# Patient Record
Sex: Male | Born: 1995 | Race: White | Hispanic: No | Marital: Single | Smoking: Never smoker
Health system: Southern US, Community
[De-identification: ages and names within clinical notes are randomized; demographics above are authoritative.]

## PROBLEM LIST (undated history)

## (undated) DIAGNOSIS — E039 Hypothyroidism, unspecified: Secondary | ICD-10-CM

## (undated) DIAGNOSIS — E0500 Thyrotoxicosis with diffuse goiter without thyrotoxic crisis or storm: Secondary | ICD-10-CM

---
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
## 2015-07-28 HISTORY — PX: THYROIDECTOMY: SHX17

## 2022-09-15 NOTE — Discharge Instructions (Signed)
T & A INSTRUCTION SHEET - [facility]  PAUL JUENGEL, MD    INFORMATION SHEET FOR A TONSILLECTOMY AND ADENDOIDECTOMY  About Your Tonsils and Adenoids  The tonsils and adenoids are normal body tissues that are part of our immune system.  They normally help to protect us against diseases that may enter our mouth and nose. However, sometimes the tonsils and/or adenoids become too large and obstruct our breathing, especially at night.    If either of these things happen it helps to remove the tonsils and adenoids in order to become healthier. The operation to remove the tonsils and adenoids is called a tonsillectomy and adenoidectomy.  The Location of Your Tonsils and Adenoids  The tonsils are located in the back of the throat on both side and sit in a cradle of muscles. The adenoids are located in the roof of the mouth, behind the nose, and closely associated with the opening of the Eustachian tube to the ear.  Surgery on Tonsils and Adenoids  A tonsillectomy and adenoidectomy is a short operation which takes about thirty minutes.  This includes being put to sleep and being awakened. Tonsillectomies and adenoidectomies are performed at [facility] and may require observation period in the recovery room prior to going home. Children are required to remain in recovery for at least 45 minutes.   Following the Operation for a Tonsillectomy  A cautery machine is used to control bleeding. Bleeding from a tonsillectomy and adenoidectomy is minimal and postoperatively the risk of bleeding is approximately four percent, although this rarely life threatening.  After your tonsillectomy and adenoidectomy post-op care at home: 1. Our patients are able to go home the same day. You may be given prescriptions for pain medications, if indicated. 2. It is extremely important to remember that fluid intake is of utmost importance after a tonsillectomy. The  amount that you drink must be maintained in the postoperative period. A good indication of whether a child is getting enough fluid is whether his/her urine output is constant. As long as children are urinating or wetting their diaper every 6 - 8 hours this is usually enough fluid intake.   3. Although rare, this is a risk of some bleeding in the first ten days after surgery. This usually occurs between day five and nine postoperatively. This risk of bleeding is approximately four percent. If you or your child should have any bleeding you should remain calm and notify our office or go directly to the emergency room at [facility] where they will contact us. Our doctors are available seven days a week for notification. We recommend sitting up quietly in a chair, place an ice pack on the front of the neck and spitting out the blood gently until we are able to contact you. Adults should gargle gently with ice water and this may help stop the bleeding. If the bleeding does not stop after a short time, i.e. 10 to 15 minutes, or seems to be increasing again, please contact us or go to the hospital.   4. It is common for the pain to be worse at 5 - 7 days postoperatively. This occurs because the "scab" is peeling off and the mucous membrane (skin of the throat) is growing back where the tonsils were.   5. It is common for a low-grade fever, less than 102, during the first week after a tonsillectomy and adenoidectomy. It is usually due to   not drinking enough liquids, and we suggest your use liquid Tylenol (acetaminophen) or the pain medicine with Tylenol (acetaminophen) prescribed in order to keep your temperature below 102. Please follow the directions on the back of the bottle. 6. Recommendations for post-operative pain in children and adults: a) For Children 12 and younger: Recommendations are for oral Tylenol (acetaminophen) and oral Motrin (ibuprofen). Administer the Tylenol (acetaminophen) and  Motrin as stated on bottle for patient's age/weight. Sometimes it may be necessary to alternate the Tylenol (acetaminophen) and Motrin for improved pain control. Motrin (ibuprofen) does last slightly longer so many patients benefit from being given this prior to bedtime. All children should avoid Aspirin products for 2 weeks following surgery. b) For children over the age of 12: Tylenol (acetaminophen) is the preferred first choice for pain control. Depending on your child's size, sometimes they will be given a combination of Tylenol (acetaminophen) and hydrocodone medication or sometimes it will be recommended they take Motrin (ibuprofen) in addition to the Tylenol (acetaminophen). Narcotics should always be used with caution in children following surgery as they can suppress their breathing and switching to over the counter Tylenol (acetaminophen) and Motrin (ibuprofen) as soon as possible is recommended. All patients should avoid Aspirin products for 2 weeks following surgery. c) Adults: Usually adults will require a narcotic pain medication following a tonsillectomy. This usually has either hydrocodone or oxycodone in it and can usually be taken every 4 to 6 hours as needed for moderate pain. If the medication does not have Tylenol (acetaminophen) in it, you may also supplement Tylenol (acetaminophen) as needed every 4 to 6 hours for breakthrough or mild pain. Adults should avoid Aspirin, Aleve, Motrin, and Ibuprofen products for 2 weeks following surgery as they can increase your risk of bleeding. 7. If you happen to look in the mirror or into your child's mouth you will see white/gray patches on the back of the throat. This is what a scab looks like in the mouth and is normal after having a tonsillectomy and adenoidectomy. They will disappear once the tonsil areas heal completely. However, it may cause a noticeable odor, and this too will disappear with time.     8. You or your child may experience ear  pain after having a tonsillectomy and adenoidectomy.  This is called referred pain and comes from the throat, but it is felt in the ears.  Ear pain is quite common and expected. It will usually go away after ten days. There is usually nothing wrong with the ears, and it is primarily due to the healing area stimulating the nerve to the ear that runs along the side of the throat. Use either the prescribed pain medicine or Tylenol (acetaminophen) as needed.  9. The throat tissues after a tonsillectomy are obviously sensitive. Smoking around children who have had a tonsillectomy significantly increases the risk of bleeding. DO NOT SMOKE!  What to Expect Each Day  First Day at Home 1. Patients will be discharged home the same day.  2. Drink at least four glasses of liquid a day. Clear, cool liquids are recommended. Fruit juices containing citric acid are not recommended because they tend to cause pain. Carbonated beverages are allowed if you pour them from glass to glass to remove the bubbles as these tend to cause discomfort. Avoid alcoholic beverages.  3. Eat very soft foods such as soups, broth, jello, custard, pudding, ice cream, popsicles, applesauce, mashed potatoes, and in general anything that you can crush between your   tongue and the roof of your mouth. Try adding Carnation Instant Breakfast Mix into your food for extra calories. It is not uncommon to lose 5 to 10 pounds of fluid weight. The weight will be gained back quickly once you're feeling better and drinking more.  4. Sleep with your head elevated on two pillows for about three days to help decrease the swelling.  5. DO NOT SMOKE!  Day Two  1. Rest as much as possible. Use common sense in your activities.  2. Continue drinking at least four glasses of liquid per day.  3. Follow the soft diet.  4. Use your pain medication as needed.  Day Three  1. Advance your activity as you are able and continue to follow the previous day's suggestions.   Days Four Through Six  1. Advance your diet and begin to eat more solid foods such as chopped hamburger. 2. Advance your activities slowly. Children should be kept mostly around the house.  3. Not uncommonly, there will be more pain at this time. It is temporary, usually lasting a day or two.  Day Seven Through Ten  1. Most individuals by this time are able to return to work or school unless otherwise instructed. Consider sending children back to school for a half day on the first day back. 

## 2022-09-20 ENCOUNTER — Encounter: Payer: Self-pay | Admitting: Otolaryngology

## 2022-09-20 NOTE — Anesthesia Preprocedure Evaluation (Signed)
Anesthesia Evaluation  Patient identified by MRN, date of birth, ID band Patient awake    Reviewed: Allergy & Precautions, H&P , NPO status , Patient's Chart, lab work & pertinent test results  Airway Mallampati: I  TM Distance: >3 FB Neck ROM: Full    Dental no notable dental hx.    Pulmonary neg pulmonary ROS   Pulmonary exam normal breath sounds clear to auscultation       Cardiovascular negative cardio ROS Normal cardiovascular exam Rhythm:Regular Rate:Normal     Neuro/Psych negative neurological ROS  negative psych ROS   GI/Hepatic negative GI ROS, Neg liver ROS,,,  Endo/Other  Hypothyroidism    Renal/GU negative Renal ROS  negative genitourinary   Musculoskeletal negative musculoskeletal ROS (+)    Abdominal   Peds negative pediatric ROS (+)  Hematology negative hematology ROS (+)   Anesthesia Other Findings Hypothyroidism Grave's dz   Reproductive/Obstetrics negative OB ROS                             Anesthesia Physical Anesthesia Plan  ASA: 2  Anesthesia Plan: General ETT   Post-op Pain Management:    Induction: Intravenous  PONV Risk Score and Plan:   Airway Management Planned: Oral ETT  Additional Equipment:   Intra-op Plan:   Post-operative Plan: Extubation in OR  Informed Consent: I have reviewed the patients History and Physical, chart, labs and discussed the procedure including the risks, benefits and alternatives for the proposed anesthesia with the patient or authorized representative who has indicated his/her understanding and acceptance.     Dental Advisory Given  Plan Discussed with: Anesthesiologist, CRNA and Surgeon  Anesthesia Plan Comments: (Patient consented for risks of anesthesia including but not limited to:  - adverse reactions to medications - damage to eyes, teeth, lips or other oral mucosa - nerve damage due to positioning  - sore  throat or hoarseness - Damage to heart, brain, nerves, lungs, other parts of body or loss of life  Patient voiced understanding.)       Anesthesia Quick Evaluation

## 2022-09-23 ENCOUNTER — Encounter
Admission: RE | Disposition: A | Discharge status: Home / Self Care | Payer: Self-pay | Source: Home / Self Care | Attending: Otolaryngology

## 2022-09-23 ENCOUNTER — Other Ambulatory Visit: Payer: Self-pay

## 2022-09-23 ENCOUNTER — Ambulatory Visit: Payer: Managed Care, Other (non HMO) | Admitting: Anesthesiology

## 2022-09-23 ENCOUNTER — Encounter: Payer: Self-pay | Admitting: Otolaryngology

## 2022-09-23 ENCOUNTER — Ambulatory Visit
Admission: RE | Admit: 2022-09-23 | Discharge: 2022-09-23 | Disposition: A | Discharge status: Home / Self Care | Payer: Managed Care, Other (non HMO) | Attending: Otolaryngology | Admitting: Otolaryngology

## 2022-09-23 DIAGNOSIS — E0500 Thyrotoxicosis with diffuse goiter without thyrotoxic crisis or storm: Secondary | ICD-10-CM | POA: Insufficient documentation

## 2022-09-23 DIAGNOSIS — J3501 Chronic tonsillitis: Secondary | ICD-10-CM | POA: Diagnosis present

## 2022-09-23 HISTORY — DX: Hypothyroidism, unspecified: E03.9

## 2022-09-23 HISTORY — DX: Thyrotoxicosis with diffuse goiter without thyrotoxic crisis or storm: E05.00

## 2022-09-23 HISTORY — PX: TONSILLECTOMY: SHX5217

## 2022-09-23 SURGERY — TONSILLECTOMY
Anesthesia: General | Laterality: Bilateral

## 2022-09-23 MED ORDER — LIDOCAINE HCL (CARDIAC) PF 100 MG/5ML IV SOSY
PREFILLED_SYRINGE | INTRAVENOUS | Status: DC | PRN
  Administered 2022-09-23: 100 mg via INTRAVENOUS

## 2022-09-23 MED ORDER — SUCCINYLCHOLINE CHLORIDE 200 MG/10ML IV SOSY
PREFILLED_SYRINGE | INTRAVENOUS | Status: DC | PRN
  Administered 2022-09-23: 100 mg via INTRAVENOUS

## 2022-09-23 MED ORDER — MIDAZOLAM HCL 5 MG/5ML IJ SOLN
INTRAMUSCULAR | Status: DC | PRN
  Administered 2022-09-23: 2 mg via INTRAVENOUS

## 2022-09-23 MED ORDER — PROPOFOL 10 MG/ML IV BOLUS
INTRAVENOUS | Status: DC | PRN
  Administered 2022-09-23: 200 mg via INTRAVENOUS

## 2022-09-23 MED ORDER — DEXMEDETOMIDINE HCL IN NACL 80 MCG/20ML IV SOLN
INTRAVENOUS | Status: DC | PRN
  Administered 2022-09-23: 8 ug via INTRAVENOUS
  Administered 2022-09-23: 4 ug via INTRAVENOUS

## 2022-09-23 MED ORDER — DEXAMETHASONE SODIUM PHOSPHATE 4 MG/ML IJ SOLN
INTRAMUSCULAR | Status: DC | PRN
  Administered 2022-09-23: 12 mg via INTRAVENOUS

## 2022-09-23 MED ORDER — OXYCODONE HCL 5 MG/5ML PO SOLN
10.0000 mg | Freq: Once | ORAL | Status: AC
  Administered 2022-09-23: 10 mg via ORAL

## 2022-09-23 MED ORDER — ACETAMINOPHEN 10 MG/ML IV SOLN: 1000.0000 mg | Freq: Once | INTRAVENOUS | Status: DC

## 2022-09-23 MED ORDER — HYDROCODONE-ACETAMINOPHEN 7.5-325 MG/15ML PO SOLN: 15.0000 mL | ORAL | 0 refills | Status: AC | PRN

## 2022-09-23 MED ORDER — ACETAMINOPHEN 10 MG/ML IV SOLN
1000.0000 mg | Freq: Once | INTRAVENOUS | Status: AC
  Administered 2022-09-23: 1000 mg via INTRAVENOUS

## 2022-09-23 MED ORDER — LACTATED RINGERS IV SOLN: INTRAVENOUS | Status: DC

## 2022-09-23 MED ORDER — FENTANYL CITRATE (PF) 100 MCG/2ML IJ SOLN
INTRAMUSCULAR | Status: DC | PRN
  Administered 2022-09-23: 100 ug via INTRAVENOUS

## 2022-09-23 MED ORDER — ONDANSETRON HCL 4 MG/2ML IJ SOLN
INTRAMUSCULAR | Status: DC | PRN
  Administered 2022-09-23: 4 mg via INTRAVENOUS

## 2022-09-23 SURGICAL SUPPLY — 13 items
ANTIFOG SOL W/FOAM PAD STRL (MISCELLANEOUS) ×1
BLADE ELECT COATED/INSUL 125 (ELECTRODE) ×1 IMPLANT
CANISTER SUCT 1200ML W/VALVE (MISCELLANEOUS) ×1 IMPLANT
ELECT REM PT RETURN 9FT ADLT (ELECTROSURGICAL) ×1
ELECTRODE REM PT RTRN 9FT ADLT (ELECTROSURGICAL) ×1 IMPLANT
GLOVE SURG GAMMEX PI TX LF 7.5 (GLOVE) ×1 IMPLANT
KIT TURNOVER KIT A (KITS) ×1 IMPLANT
PACK TONSIL AND ADENOID CUSTOM (PACKS) ×1 IMPLANT
PENCIL SMOKE EVACUATOR (MISCELLANEOUS) ×1 IMPLANT
SLEEVE SUCTION 125 (MISCELLANEOUS) ×1 IMPLANT
SOLUTION ANTFG W/FOAM PAD STRL (MISCELLANEOUS) ×1 IMPLANT
SPONGE TONSIL 1 RF SGL (DISPOSABLE) ×1 IMPLANT
STRAP BODY AND KNEE 60X3 (MISCELLANEOUS) ×1 IMPLANT

## 2022-09-23 NOTE — Op Note (Signed)
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  Once he was fully asleep a Dingma retractor with a #4 tongue blade was used for stabilizing the tube and opening the airway.  His tonsils were extremely large on both sides and flat in the middle where they were touching in the midline.  The soft palate was retracted and his adenoids were very flat and not enlarged at all.  The left tonsil was grasped and pulled medially.  The anterior pillar was incised.  The tonsil was dissected from its fossa using blunt dissection and electrocautery.  There were several large vessels coming into the tonsil that were cauterized.  The entire tonsil was removed and sent to pathology.  Bleeding was controlled with direct pressure and electrocautery.  The right side was done in a similar fashion.  The tonsil was pulled out and the anterior pillar incised.  It was dissected from its fossa and using blunt dissection and electrocautery with again a couple of the large blood vessels coming from the lateral wall into the tonsil.  These were cauterized with electrocautery.  Once the complete tonsil was removed it was sent for permanent section.  The remaining bleeding was controlled with cautery.  The tonsil beds were nice and dry and there is no further bleeding.  The airway was much larger now in his pharynx because of the huge tonsils being removed.  Patient tolerated the procedure well.  He was awakened taken to the recovery room in satisfactory condition.  There were no operative complications.  Dispo:   To PACU to be discharged home  Plan: To follow-up in the office in a couple weeks to  make sure he is healing well.  He will push fluids at home and slowly increase his diet as tolerated.  He is given Tylenol with hydrocodone liquid to use for pain as needed.  He can switch to plain Tylenol when he feels better.  Beverly Sessions [name]  09/23/2022 10:31 AM

## 2022-09-23 NOTE — Transfer of Care (Signed)
Immediate Anesthesia Transfer of Care Note  Patient: Frank Blackburn  Procedure(s) Performed: TONSILLECTOMY (Bilateral)  Patient Location: PACU  Anesthesia Type: General ETT  Level of Consciousness: awake, alert  and patient cooperative  Airway and Oxygen Therapy: Patient Spontanous Breathing and Patient connected to supplemental oxygen  Post-op Assessment: Post-op Vital signs reviewed, Patient's Cardiovascular Status Stable, Respiratory Function Stable, Patent Airway and No signs of Nausea or vomiting  Post-op Vital Signs: Reviewed and stable  Complications: No notable events documented.

## 2022-09-23 NOTE — Anesthesia Procedure Notes (Signed)
Procedure Name: Intubation Date/Time: 09/23/2022 10:05 AM  Performed by: Nawaf Strange, [place], CRNAPre-anesthesia Checklist: Patient identified, Patient being monitored, Timeout performed, Emergency Drugs available and Suction available Patient Re-evaluated:Patient Re-evaluated prior to induction Oxygen Delivery Method: Circle system utilized Preoxygenation: Pre-oxygenation with 100% oxygen Induction Type: IV induction Ventilation: Mask ventilation without difficulty Laryngoscope Size: Mac and 4 Grade View: Grade I Tube type: Oral Rae Tube size: 7.5 mm Number of attempts: 1 Airway Equipment and Method: Stylet Placement Confirmation: ETT inserted through vocal cords under direct vision, positive ETCO2 and breath sounds checked- equal and bilateral Secured at: 23 cm Tube secured with: Tape Dental Injury: Teeth and Oropharynx as per pre-operative assessment

## 2022-09-23 NOTE — Anesthesia Postprocedure Evaluation (Signed)
Anesthesia Post Note  Patient: Frank Blackburn  Procedure(s) Performed: TONSILLECTOMY (Bilateral)  Patient location during evaluation: PACU Anesthesia Type: General Level of consciousness: awake and alert Pain management: pain level controlled Vital Signs Assessment: post-procedure vital signs reviewed and stable Respiratory status: spontaneous breathing, nonlabored ventilation, respiratory function stable and patient connected to nasal cannula oxygen Cardiovascular status: blood pressure returned to baseline and stable Postop Assessment: no apparent nausea or vomiting Anesthetic complications: no   No notable events documented.   Last Vitals:  Vitals:   09/23/22 1110 09/23/22 1115  BP:  118/82  Pulse: (!) 52 (!) 53  Resp: 15 14  Temp:  36.5 C  SpO2: 95% 97%    Last Pain:  Vitals:   09/23/22 1115  TempSrc:   PainSc: 7                  Adric Wrede C Genessa Beman

## 2022-09-23 NOTE — H&P (Signed)
H&P has been reviewed and patient reevaluated, no changes necessary. To be downloaded later.  

## 2022-09-24 ENCOUNTER — Encounter: Payer: Self-pay | Admitting: Otolaryngology
